# Patient Record
Sex: Female | Born: 1980 | Race: White | Hispanic: No | Marital: Married | Smoking: Current every day smoker
Health system: Southern US, Community
[De-identification: ages and names within clinical notes are randomized; demographics above are authoritative.]

## PROBLEM LIST (undated history)

## (undated) DIAGNOSIS — M5430 Sciatica, unspecified side: Secondary | ICD-10-CM

## (undated) DIAGNOSIS — M549 Dorsalgia, unspecified: Secondary | ICD-10-CM

## (undated) HISTORY — PX: OTHER SURGICAL HISTORY: SHX169

## (undated) HISTORY — DX: Dorsalgia, unspecified: M54.9

## (undated) HISTORY — DX: Sciatica, unspecified side: M54.30

---

## 2008-03-01 HISTORY — PX: ABDOMINAL HYSTERECTOMY: SHX81

## 2016-05-24 ENCOUNTER — Ambulatory Visit
Admission: RE | Admit: 2016-05-24 | Discharge: 2016-05-24 | Disposition: A | Discharge status: Home / Self Care | Payer: Self-pay | Source: Ambulatory Visit | Attending: Oncology | Admitting: Oncology

## 2016-05-24 ENCOUNTER — Encounter: Payer: Self-pay | Admitting: *Deleted

## 2016-05-24 ENCOUNTER — Ambulatory Visit: Payer: Self-pay | Attending: Oncology | Admitting: *Deleted

## 2016-05-24 ENCOUNTER — Other Ambulatory Visit: Payer: Self-pay | Admitting: Oncology

## 2016-05-24 ENCOUNTER — Encounter (INDEPENDENT_AMBULATORY_CARE_PROVIDER_SITE_OTHER): Payer: Self-pay

## 2016-05-24 VITALS — BP 106/74 | HR 56 | Temp 98.5°F | Ht 61.0 in | Wt 137.0 lb

## 2016-05-24 DIAGNOSIS — R928 Other abnormal and inconclusive findings on diagnostic imaging of breast: Secondary | ICD-10-CM

## 2016-05-24 DIAGNOSIS — N63 Unspecified lump in unspecified breast: Secondary | ICD-10-CM

## 2016-05-24 DIAGNOSIS — N6002 Solitary cyst of left breast: Secondary | ICD-10-CM

## 2016-05-24 NOTE — Progress Notes (Signed)
Subjective:     Patient ID: Francine GravenSabrina Ow, female   DOB: 06/23/1980, [age] y.o.   MRN: [id number]  HPI   Review of Systems     Objective:   Physical Exam  Pulmonary/Chest: Right breast exhibits mass. Right breast exhibits no inverted nipple, no nipple discharge, no skin change and no tenderness. Left breast exhibits mass. Left breast exhibits no inverted nipple, no nipple discharge, no skin change and no tenderness. Breasts are symmetrical.         Assessment:     36 year old White female referred by Alvina Filbertenise Hunter, [facility] further evaluation of bilateral breast masses.  Patient states she found a tender left breast mass in October, 2017.  States it is unchanged.  States when she saw her doctor, she found the right breast mass on exam.  On clinical breast exam I can palpate an approximate 3 cm smooth, mobile, fluid feeling mass at 2-3:00 right breast and an approximate 2 cm lobulated tender mass at 2:30-3:00 left breast.  Patient with a partial hysterectomy in her twenties for post delivery bleeding per patient.  Family history includes 2 maternal cousins with breast cancer.  Patient has been screened for eligibility.  She does not have any insurance, Medicare or Medicaid.  She also meets financial eligibility.  Hand-out given on the Affordable Care Act.    Plan:     Will get bilateral diagnostic mammogram and ultrasound.  If no findings on imaging will refer for surgical consult.  Patient agreeable to the plan.

## 2016-05-24 NOTE — Patient Instructions (Signed)
Gave patient hand-out, Women Staying Healthy, Active and Well from [facility], with education on breast health, pap smears, heart and colon health. 

## 2016-05-27 ENCOUNTER — Encounter: Payer: Self-pay | Admitting: *Deleted

## 2016-05-27 ENCOUNTER — Telehealth: Payer: Self-pay | Admitting: *Deleted

## 2016-05-27 NOTE — Telephone Encounter (Signed)
Called and discussed results of patients mammogram and ultrasound.  Patient would like to have bilateral cysts aspirated since they are both tender.  Patient is scheduled to see Dr. Lemar LivingsByrnett on 06/07/16 at  1:30.  She is to arrive 30 minutes early to complete paperwork.  She is to take a photo ID and all her meds with her to the appointment.

## 2016-06-07 ENCOUNTER — Inpatient Hospital Stay: Payer: Self-pay

## 2016-06-07 ENCOUNTER — Ambulatory Visit (INDEPENDENT_AMBULATORY_CARE_PROVIDER_SITE_OTHER): Payer: PRIVATE HEALTH INSURANCE | Admitting: General Surgery

## 2016-06-07 ENCOUNTER — Encounter: Payer: Self-pay | Admitting: General Surgery

## 2016-06-07 VITALS — BP 120/80 | HR 76 | Resp 12 | Ht 60.0 in | Wt 141.0 lb

## 2016-06-07 DIAGNOSIS — N6002 Solitary cyst of left breast: Secondary | ICD-10-CM

## 2016-06-07 DIAGNOSIS — N6001 Solitary cyst of right breast: Secondary | ICD-10-CM

## 2016-06-07 NOTE — Progress Notes (Signed)
Patient ID: Sherry Mccarthy, female   DOB: August 05, 1980, 36 y.o.   MRN: [id number]  Chief Complaint  Patient presents with  . Other    breast cysts    HPI Sherry Mccarthy is a 36 y.o. female here for evaluation of bilateral breast cysts. She had a mammogram with ultrasound done on 05/24/16. She reports that her left breast has been hurting since 11/2015. She also reports the right breast has been painful for the past month. She reports no past breast problems. Her husband Barbara Cower is with her today.  She reports she continually wears a bra. She reports that the left breast pain in constant. She has used OTC pain medication without relief.   HPI  Past Medical History:  Diagnosis Date  . Back pain   . Sciatica     Past Surgical History:  Procedure Laterality Date  . ABDOMINAL HYSTERECTOMY  2010   partial  . carpel tunnel Right   . CESAREAN SECTION     X 2    Family History  Problem Relation Age of Onset  . Breast cancer Neg Hx     Social History Social History  Substance Use Topics  . Smoking status: Current Every Day Smoker    Packs/day: 0.50    Years: 15.00    Types: Cigarettes  . Smokeless tobacco: Never Used  . Alcohol use No    Allergies  Allergen Reactions  . Fentanyl Anaphylaxis  . Tramadol Shortness Of Breath and Rash  . Morphine And Related Rash    Current Outpatient Prescriptions  Medication Sig Dispense Refill  . gabapentin (NEURONTIN) 300 MG capsule Take 300 mg by mouth 3 (three) times daily.     No current facility-administered medications for this visit.     Review of Systems Review of Systems  Constitutional: Negative.   Respiratory: Negative.   Cardiovascular: Negative.     Blood pressure 120/80, pulse 76, resp. rate 12, height 5' (1.524 m), weight 141 lb (64 kg).  Physical Exam Physical Exam  Constitutional: She is oriented to person, place, and time. She appears well-developed and well-nourished.  Eyes: Conjunctivae are normal. No  scleral icterus.  Neck: Normal range of motion.  Cardiovascular: Normal rate, regular rhythm and normal heart sounds.   Pulmonary/Chest: Effort normal and breath sounds normal. Right breast exhibits mass (fullness in the upper inner quadrant) and tenderness. Right breast exhibits no inverted nipple, no nipple discharge and no skin change. Left breast exhibits mass (2 o'clk 5 cm from nipple there is a soft fullness) and tenderness. Left breast exhibits no inverted nipple, no nipple discharge and no skin change.    Lymphadenopathy:    She has no cervical adenopathy.    She has no axillary adenopathy.  Neurological: She is alert and oriented to person, place, and time.  Skin: Skin is warm and dry.  Psychiatric: She has a normal mood and affect.    Data Reviewed 05/24/2016 bilateral mammogram and ultrasound results reviewed. 1.  No mammographic or ultrasound evidence for malignancy. 2. Bilateral simple cysts. 3. Patient requests aspiration of left breast cyst, which is scheduled for the patient.  RECOMMENDATION: 1. Aspiration of left breast cysts. 2. Screening mammogram at age 22 unless there are persistent or intervening clinical concerns. (Code:[id number]-B-40A)  The left breast cyst is small, and the possibility that aspiration will not relieve all of her present breast tenderness was discussed. She desired to proceed with bilateral cyst aspiration.  The left breast at the 2:00 position 5  cm from the nipple showed a well-defined anechoic structure measuring 1.6 x 1.3 x 1.8 cm. This was aspirated using 1 mL of 1% plain Xylocaine with complete resolution. Deeper in the breast adjacent to the pectoralis fascia was a bilobed anechoic structure measuring 0.6 x 1.5 x 1.8 cm. This was aspirated as well with complete resolution.  The right breast examination showed multiple cysts in the area of palpable fullness, the largest measuring 3.6 cm in diameter. Using 1 mL of 1% plain Xylocaine teacher these  lesions was aspirated with complete resolution. BI-RADS-2.  Assessment    Bilateral breast cyst, status post aspiration.    Plan    The patient has been encouraged to make use of Aleve for local discomfort and he consider making use of Protegra/Ocuvite antioxidant vitamins to discourage future cyst formation.  We'll look for a phone follow-up or clinical reassessment in 2 weeks.    HPI, Physical Exam, Assessment and Plan have been scribed under the direction and in the presence of Earline Mayotte, MD  Carron Brazen, LPN  I have completed the exam and reviewed the above documentation for accuracy and completeness.  I agree with the above.  Museum/gallery conservator has been used and any errors in dictation or transcription are unintentional.  Donnalee Curry, M.D., F.A.C.S.  Earline Mayotte 06/08/2016, 8:29 PM

## 2016-06-07 NOTE — Patient Instructions (Addendum)
May try antioxidant vitamins- Ocuvite- twice a day May use Aleve twice daily for 14 days  Follow up in 2 weeks.

## 2016-06-08 DIAGNOSIS — N6001 Solitary cyst of right breast: Secondary | ICD-10-CM | POA: Insufficient documentation

## 2016-06-08 DIAGNOSIS — N6002 Solitary cyst of left breast: Principal | ICD-10-CM

## 2016-06-10 ENCOUNTER — Encounter: Payer: Self-pay | Admitting: *Deleted

## 2016-06-10 NOTE — Progress Notes (Signed)
Patient saw Dr. Lemar Livings of surgical consult.  Benign cysts aspiration.  She is to follow-up with screening mammograms at age 36 unless otherwise indicated.  [facility] to [place].

## 2016-07-13 ENCOUNTER — Ambulatory Visit (INDEPENDENT_AMBULATORY_CARE_PROVIDER_SITE_OTHER): Payer: PRIVATE HEALTH INSURANCE | Admitting: General Surgery

## 2016-07-13 ENCOUNTER — Encounter: Payer: Self-pay | Admitting: General Surgery

## 2016-07-13 VITALS — BP 110/66 | HR 76 | Resp 14 | Ht 62.0 in | Wt 146.0 lb

## 2016-07-13 DIAGNOSIS — N644 Mastodynia: Secondary | ICD-10-CM | POA: Diagnosis not present

## 2016-07-13 MED ORDER — TAMOXIFEN CITRATE 20 MG PO TABS: 20.0000 mg | ORAL_TABLET | Freq: Every day | ORAL | 11 refills | Status: AC

## 2016-07-13 NOTE — Progress Notes (Signed)
Patient ID: Sherry Mccarthy, female   DOB: 1980/06/15, 36 y.o.   MRN: [id number]  Chief Complaint  Patient presents with  . Follow-up    breast pain    HPI Sherry Mccarthy is a 36 y.o. female.  Here for follow up breast pain, left > right. She states the cyst were drained last time (April) that made them feel better. She states the tenderness started a couple of weeks ago. The tenderness is more on the lateral side of left breast. She states it seems to be tender on a more constant basis. She does admit to 2-12 ounce Mt Dew beverages a day.  HPI  Past Medical History:  Diagnosis Date  . Back pain   . Sciatica     Past Surgical History:  Procedure Laterality Date  . ABDOMINAL HYSTERECTOMY  2010   partial  . carpel tunnel Right   . CESAREAN SECTION     X 2    Family History  Problem Relation Age of Onset  . Breast cancer Neg Hx     Social History Social History  Substance Use Topics  . Smoking status: Current Every Day Smoker    Packs/day: 0.50    Years: 15.00    Types: Cigarettes  . Smokeless tobacco: Never Used  . Alcohol use No    Allergies  Allergen Reactions  . Fentanyl Anaphylaxis  . Tramadol Shortness Of Breath and Rash  . Morphine And Related Rash    Current Outpatient Prescriptions  Medication Sig Dispense Refill  . gabapentin (NEURONTIN) 300 MG capsule Take 300 mg by mouth 3 (three) times daily.    . tamoxifen (NOLVADEX) 20 MG tablet Take 1 tablet (20 mg total) by mouth daily. 30 tablet 11   No current facility-administered medications for this visit.     Review of Systems Review of Systems  Constitutional: Negative.   Respiratory: Negative.   Cardiovascular: Negative.   Neurological: Positive for headaches.    Blood pressure 110/66, pulse 76, resp. rate 14, height 5\' 2"  (1.575 m), weight 146 lb (66.2 kg).  Physical Exam Physical Exam  Constitutional: She is oriented to person, place, and time. She appears well-developed and  well-nourished.  HENT:  Mouth/Throat: Oropharynx is clear and moist.  Eyes: Conjunctivae are normal. No scleral icterus.  Neck: Neck supple.  Pulmonary/Chest: Right breast exhibits no inverted nipple, no mass, no nipple discharge, no skin change and no tenderness. Left breast exhibits tenderness. Left breast exhibits no inverted nipple, no mass, no nipple discharge and no skin change.    Tender left UOQ  Lymphadenopathy:    She has no cervical adenopathy.    She has no axillary adenopathy.  Neurological: She is alert and oriented to person, place, and time.  Skin: Skin is warm and dry.  Psychiatric: Her behavior is normal.    Data Reviewed Prior ultrasound and mammograms.  Assessment    Mastalgia.    Plan    Patient thinks it's unlikely she'll be able to discontinue caffeine consumption. She does report that she is making use of Ocuvite vitamins for anti-inflammatory effects as requested.  Option for trial of hormone suppression was reviewed. Risks of DVT discussed. We'll make use of tamoxifen 20 mg daily for 1 month with a phone/my chart message at that time. Patient make use of a daily baby aspirin to minimize DVT risk.     Discussed options. Trial of Tamoxifen for one month and baby aspirin daily. Follow up in one month. The patient  is aware to call back for any questions or concerns.    HPI, Physical Exam, Assessment and Plan have been scribed under the direction and in the presence of Earline MayotteJeffrey W. Byrnett, MD.  Dorathy DaftMarsha Hatch, RN  I have completed the exam and reviewed the above documentation for accuracy and completeness.  I agree with the above.  Museum/gallery conservatorDragon Technology has been used and any errors in dictation or transcription are unintentional.  Donnalee CurryJeffrey Byrnett, M.D., F.A.C.S.   Earline MayotteByrnett, Jeffrey W 07/14/2016, 8:02 PM

## 2016-07-13 NOTE — Patient Instructions (Addendum)
The patient is aware to call back for any questions or concerns. Baby aspirin daily as well

## 2016-07-14 DIAGNOSIS — N644 Mastodynia: Secondary | ICD-10-CM | POA: Insufficient documentation

## 2016-08-17 ENCOUNTER — Telehealth: Payer: Self-pay | Admitting: *Deleted

## 2016-08-17 ENCOUNTER — Ambulatory Visit: Payer: PRIVATE HEALTH INSURANCE | Admitting: General Surgery

## 2016-08-17 NOTE — Telephone Encounter (Signed)
-----   Message from Earline MayotteJeffrey W Byrnett, MD sent at 08/17/2016  4:11 PM EDT ----- Patient placed on Tamoxifen last month for breast pain. Was to give a phone/ my chart f/u message this month. Missed today's appt.  See how it is doing. Thanks.

## 2016-08-24 NOTE — Telephone Encounter (Signed)
Notified patient, she is on vacation and will call back to r/s missed appt. She states the medication is helping so far.

## 2016-09-14 ENCOUNTER — Encounter: Payer: Self-pay | Admitting: *Deleted

## 2016-09-29 ENCOUNTER — Ambulatory Visit: Payer: PRIVATE HEALTH INSURANCE | Admitting: General Surgery

## 2016-10-27 ENCOUNTER — Ambulatory Visit: Payer: PRIVATE HEALTH INSURANCE | Admitting: General Surgery

## 2016-11-09 ENCOUNTER — Other Ambulatory Visit: Payer: Self-pay | Admitting: Internal Medicine

## 2016-11-10 ENCOUNTER — Other Ambulatory Visit: Payer: Self-pay | Admitting: Internal Medicine

## 2016-11-10 ENCOUNTER — Other Ambulatory Visit (HOSPITAL_COMMUNITY): Payer: Self-pay | Admitting: Internal Medicine

## 2016-11-10 DIAGNOSIS — R102 Pelvic and perineal pain: Secondary | ICD-10-CM

## 2016-11-11 ENCOUNTER — Ambulatory Visit: Payer: PRIVATE HEALTH INSURANCE | Admitting: General Surgery

## 2016-11-23 ENCOUNTER — Ambulatory Visit: Payer: 59

## 2016-12-28 ENCOUNTER — Encounter: Payer: Self-pay | Admitting: *Deleted

## 2017-01-10 ENCOUNTER — Encounter: Payer: Self-pay | Admitting: General Surgery

## 2017-01-10 ENCOUNTER — Ambulatory Visit (INDEPENDENT_AMBULATORY_CARE_PROVIDER_SITE_OTHER): Payer: PRIVATE HEALTH INSURANCE | Admitting: General Surgery

## 2017-01-10 ENCOUNTER — Inpatient Hospital Stay: Payer: Self-pay

## 2017-01-10 VITALS — BP 120/70 | HR 82 | Resp 12 | Ht 60.0 in | Wt 148.0 lb

## 2017-01-10 DIAGNOSIS — N644 Mastodynia: Secondary | ICD-10-CM

## 2017-01-10 NOTE — Patient Instructions (Addendum)
Take one 81mg  Aspirin daily. Try taking Ocuvite vitamins again for 3 months.

## 2017-01-10 NOTE — Progress Notes (Signed)
Patient ID: Sherry Mccarthy, female   DOB: September 11, 1980, 36 y.o.   MRN: [id number]  Chief Complaint  Patient presents with  . Follow-up    mastalgia    HPI Sherry Mccarthy is a 36 y.o. female here today for her one month left mastalgia. She has some tenderness and soreness that has worsened. She has been using the Tamoxifen and this has helped with hot flashes and breast pain. She is not using the Ocuvite Vitamins. She is her today with her daughter.  The patient made use of Ocuvite vitamins for about a month. She is still drinking [employer]he[employer]ountain Dew 2 cans a day and still smokes.   HPI  Past Medical History:  Diagnosis Date  . Back pain   . Sciatica     Past Surgical History:  Procedure Laterality Date  . ABDOMINAL HYSTERECTOMY  2010   partial  . carpel tunnel Right   . CESAREAN SECTION     X 2    Family History  Problem Relation Age of Onset  . Breast cancer Neg Hx     Social History Social History   Tobacco Use  . Smoking status: Current Every Day Smoker    Packs/day: 0.50    Years: 15.00    Pack years: 7.50    Types: Cigarettes  . Smokeless tobacco: Never Used  Substance Use Topics  . Alcohol use: No  . Drug use: No    Allergies  Allergen Reactions  . Fentanyl Anaphylaxis  . Tramadol Shortness Of Breath and Rash  . Morphine And Related Rash    Current Outpatient Medications  Medication Sig Dispense Refill  . gabapentin (NEURONTIN) 300 MG capsule Take 300 mg by mouth 3 (three) times daily.    . tamoxifen (NOLVADEX) 20 MG tablet Take 1 tablet (20 mg total) by mouth daily. 30 tablet 11   No current facility-administered medications for this visit.     Review of Systems Review of Systems  Constitutional: Negative.   Respiratory: Negative.   Cardiovascular: Negative.     Blood pressure 120/70, pulse 82, resp. rate 12, height 5' (1.524 m), weight 148 lb (67.1 kg).  Physical Exam Physical Exam  Constitutional: She is oriented to person, place, and  time. She appears well-developed and well-nourished.  Eyes: Conjunctivae are normal. No scleral icterus.  Neck: Neck supple.  Cardiovascular: Normal rate, regular rhythm and normal heart sounds.  Pulmonary/Chest: Effort normal and breath sounds normal. Right breast exhibits no inverted nipple, no mass, no nipple discharge, no skin change and no tenderness. Left breast exhibits no inverted nipple, no mass, no nipple discharge, no skin change and no tenderness.    Lymphadenopathy:    She has no cervical adenopathy.    She has no axillary adenopathy.  Neurological: She is alert and oriented to person, place, and time.  Skin: Skin is warm and dry.  Psychiatric: She has a normal mood and affect.    Data Reviewed Ultrasound examination was undertaken to determine if any significant cystic disease accounting for focal tenderness was evident. The patient shows significant dilated ducts in the retroareolar area up to 0.49 cm. No intraductal lesions are noted. (No history of nipple drainage. There are small cysts throughout the breast, the largest measuring 0.51 cm in the 3:00 position, 2 cm from the nipple. 2 small to warrant aspiration. BIRAD-2.  Assessment    Mastalgia, persistent activities that would worsen her symptoms including smoking and caffeine consumption.    Plan  Discussed use of Ocuvite Vitamins, decreased caffeine intake, and smoking cessation.   We will plan for repeat clinical exam in spring 2019with mammograms prior to that visit.     HPI, Physical Exam, Assessment and Plan have been scribed under the direction and in the presence of Earline MayotteJeffrey Mccarthy. Hermen Mario, MD  Sherry Brazenaryl-Lyn Kennedy, LPN  I have completed the exam and reviewed the above documentation for accuracy and completeness.  I agree with the above.  Museum/gallery conservatorDragon Mccarthy has been used and any errors in dictation or transcription are unintentional.  Sherry Mccarthy, M.D., F.A.C.S.   Earline MayotteByrnett, Sherry Mccarthy 01/10/2017, 8:14  PM

## 2017-05-18 ENCOUNTER — Telehealth: Payer: Self-pay | Admitting: *Deleted

## 2017-05-18 MED ORDER — TAMOXIFEN CITRATE 20 MG PO TABS: 20.0000 mg | ORAL_TABLET | Freq: Every day | ORAL | 1 refills | Status: AC

## 2017-05-18 NOTE — Telephone Encounter (Signed)
Left detailed message.   

## 2017-05-18 NOTE — Telephone Encounter (Signed)
Patient was last seen in 01/10/2017, she stated that she is moving back to [place] next Saturday March 30th. She is currently on Tamoxifen and has just a few refills left. Patient was wanting to know if she can get more refills until she can find a physician in [place] or does she need to come in for an appointment before the end of next week?

## 2017-05-18 NOTE — Telephone Encounter (Signed)
Patient has made use of tamoxifen 2 mg daily for control of breast pain and mastalgia.  We will renew a prescription to allow her time to find a new physician in [place].  She will be asked to make use of a pediatric aspirin daily to minimize the risk of DVT.

## 2017-12-31 IMAGING — US US BREAST*L* LIMITED INC AXILLA
1 series · 6 of 6 positions shown · non-contrast
Comparison: None, baseline

CLINICAL DATA: Palpable abnormalities bilaterally.

EXAM:
2D DIGITAL DIAGNOSTIC BILATERAL MAMMOGRAM WITH CAD AND ADJUNCT TOMO
ULTRASOUND BILATERAL BREAST

[Series 1: us breast*left* limited inc axilla · 0.06mm/px · 6 of 6 slices shown]
[im 1/6]
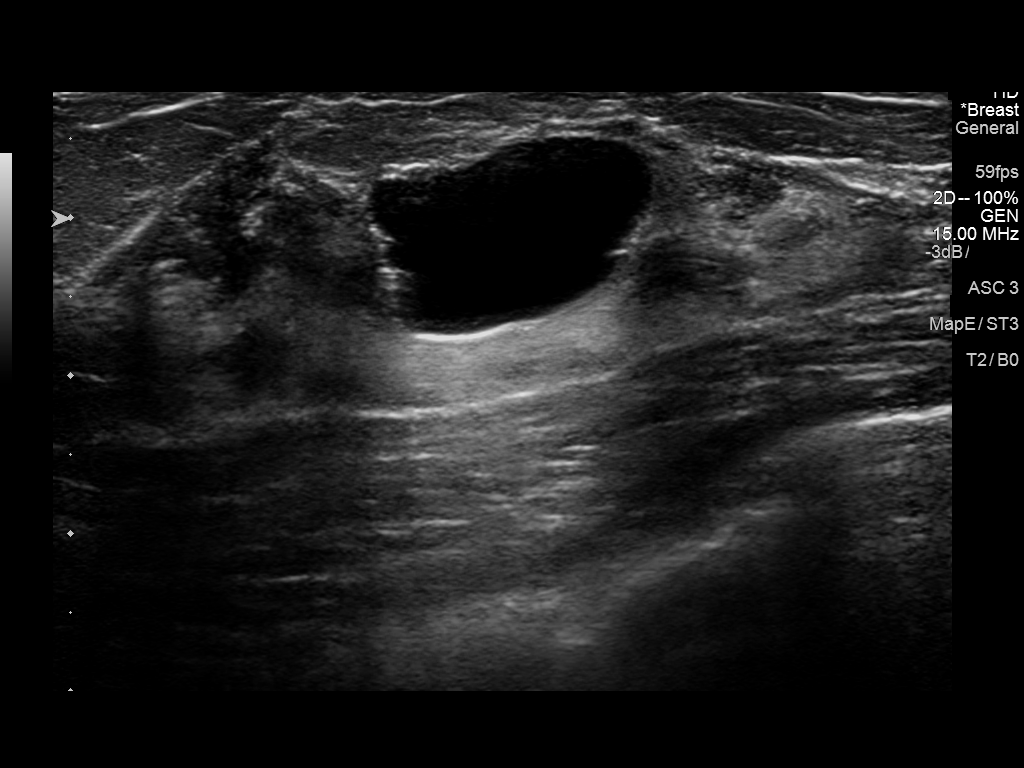
[im 2/6]
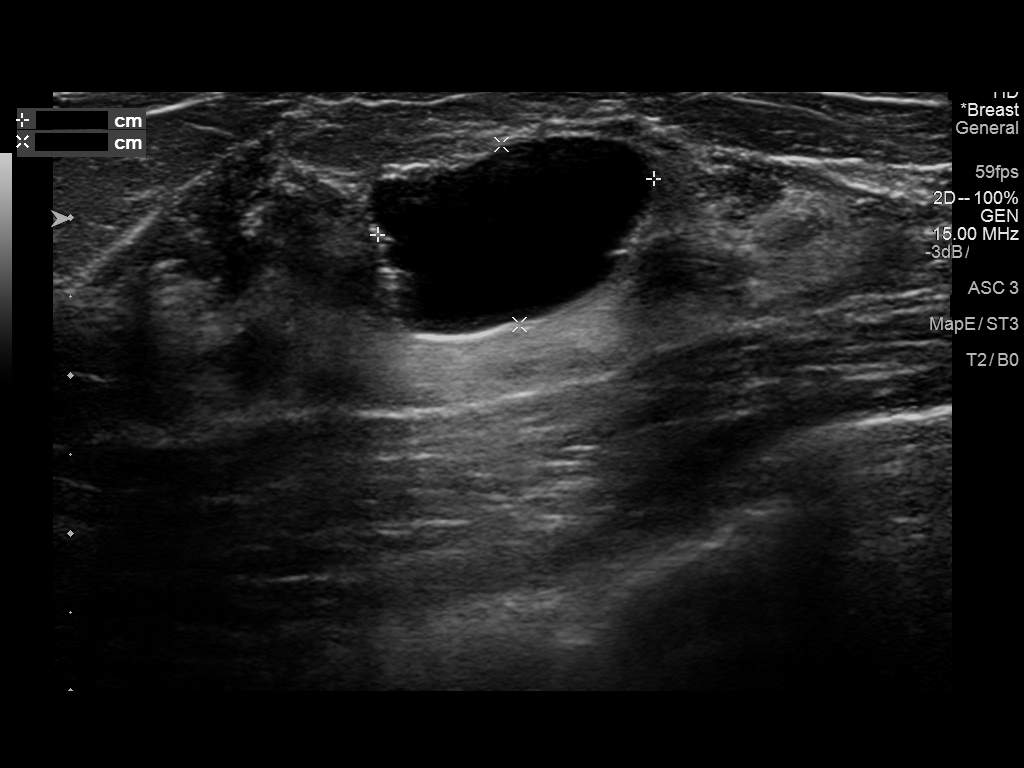
[im 3/6]
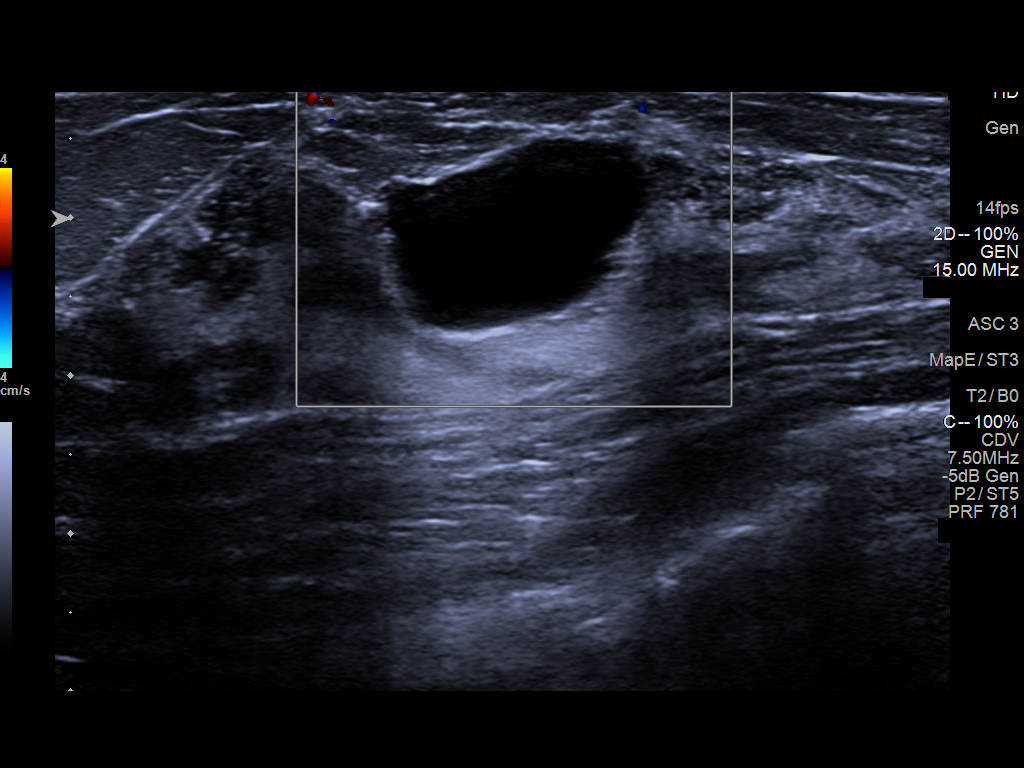
[im 4/6]
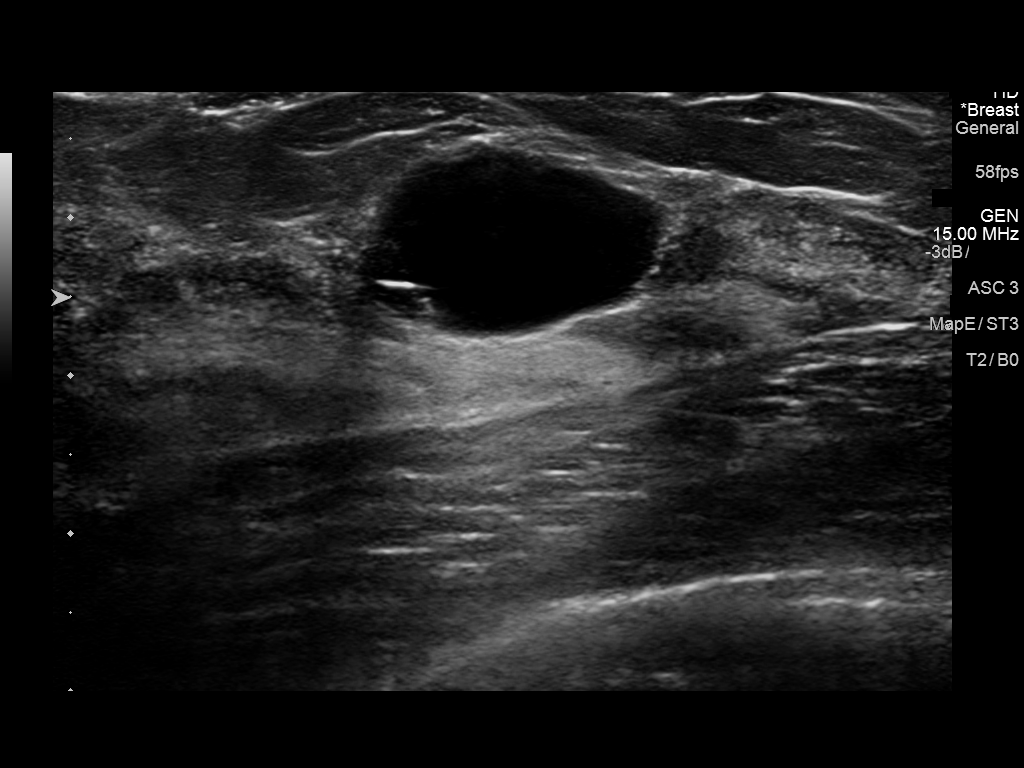
[im 5/6]
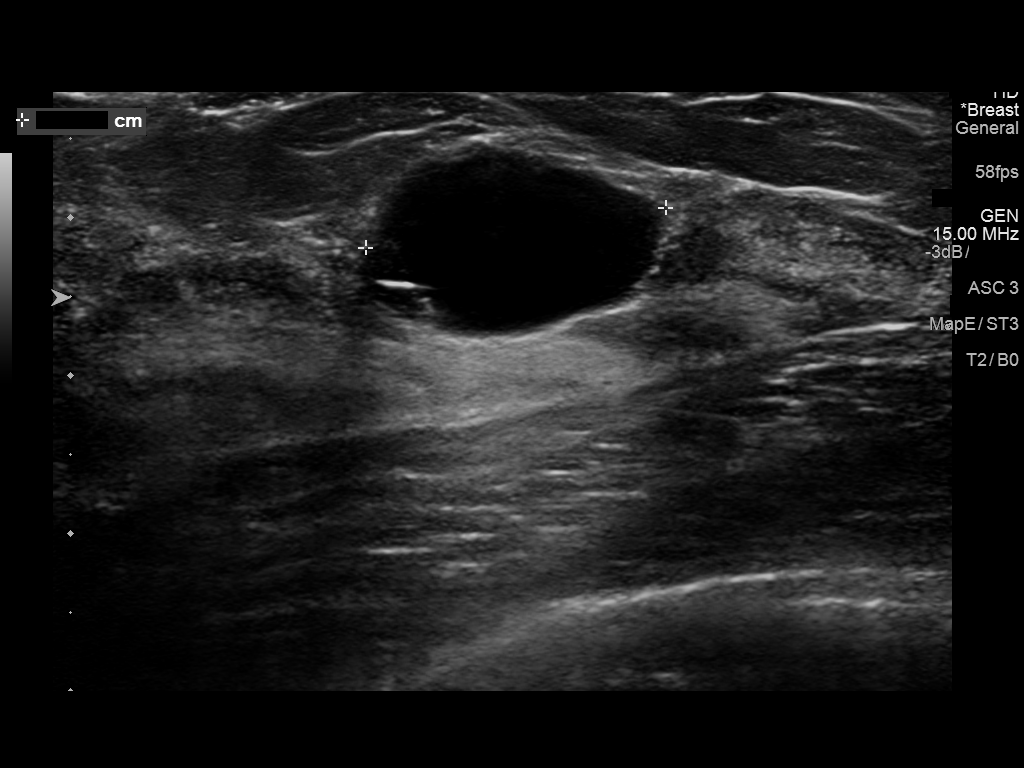
[im 6/6]
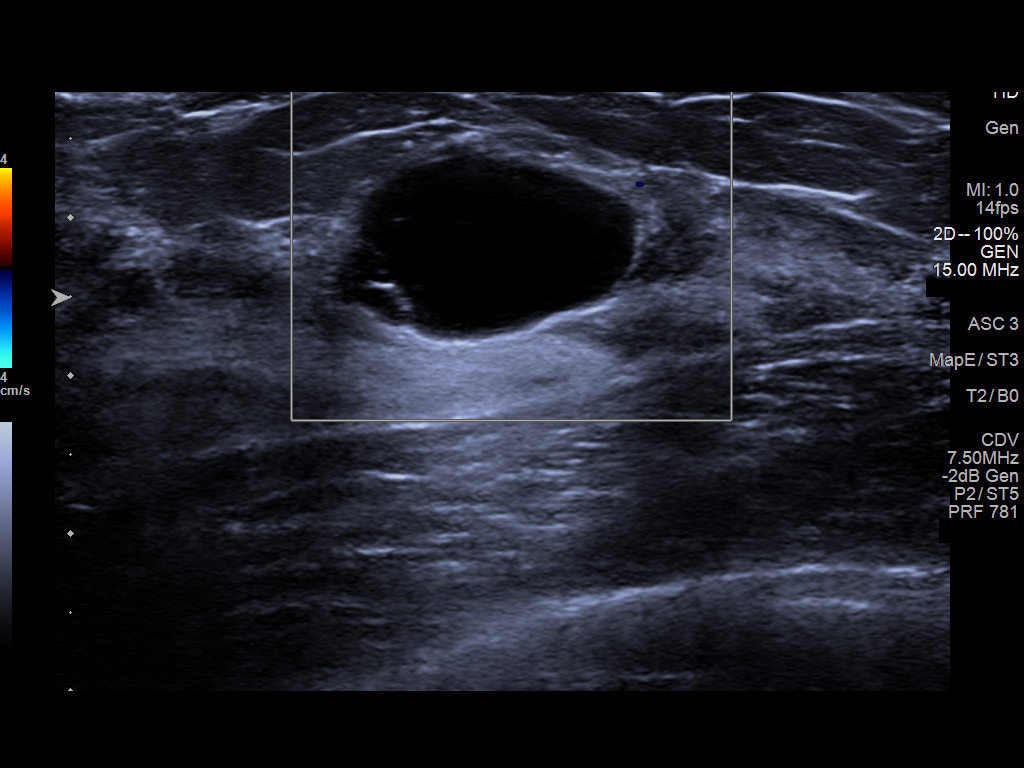

[6 of 6 positions shown; findings below may reference images not displayed]

ACR Breast Density Category d: The breast tissue is extremely dense,
which lowers the sensitivity of mammography.
FINDINGS: There are partially obscured round masses bilaterally. Within the
medial portion of the right breast, there is a circumscribed oval
mass corresponding to the area of patient's concern. In the
upper-outer quadrant of the left breast, dense fibroglandular tissue
is imaged on spot tangential view in the area of concern. No areas
of distortion or suspicious microcalcifications are identified.

Mammographic images were processed with CAD.

Right breast: On physical exam, I palpate a rounded mobile mass in
the medial aspect of the right breast.

Targeted ultrasound is performed, showing a benign cyst in the 3
o'clock location of the right breast 5 cm from nipple which measures
3.5 x 1.6 x 2.7 cm. An adjacent cyst with single septation is 3.1 x
1.6 x 3.2 cm.

Left breast: I palpate soft breast tissue in the upper-outer
quadrant of the left breast in the area of patient's concern.
Although tender on physical exam, I palpate no discrete mass in this
region. Targeted ultrasound in this region reveals a simple cyst in
the 2 o'clock location of the left breast 3 cm from nipple measuring
1.8 x 1.1 x 1.9 cm. No solid mass or acoustic shadowing identified.
The patient reports significant tenderness associated with the left
breast cyst. We discussed the option of cyst aspiration.
IMPRESSION: 1.  No mammographic or ultrasound evidence for malignancy.
2. Bilateral simple cysts.
3. Patient requests aspiration of left breast cyst, which is
scheduled for the patient.

RECOMMENDATION:
1. Aspiration of left breast cysts.
2. Screening mammogram at age 40 unless there are persistent or
intervening clinical concerns. (Code:R1-E-S23)

I have discussed the findings and recommendations with the patient.
Results were also provided in writing at the conclusion of the
visit. If applicable, a reminder letter will be sent to the patient
regarding the next appointment.

BI-RADS CATEGORY  2: Benign.

## 2017-12-31 IMAGING — US US BREAST*R* LIMITED INC AXILLA
1 series · 11 of 11 positions shown · non-contrast
Comparison: None, baseline

CLINICAL DATA: Palpable abnormalities bilaterally.

EXAM:
2D DIGITAL DIAGNOSTIC BILATERAL MAMMOGRAM WITH CAD AND ADJUNCT TOMO
ULTRASOUND BILATERAL BREAST

[Series 1: us breast*right* limited inc axilla · 0.06mm/px · 11 of 11 slices shown]
[im 1/11]
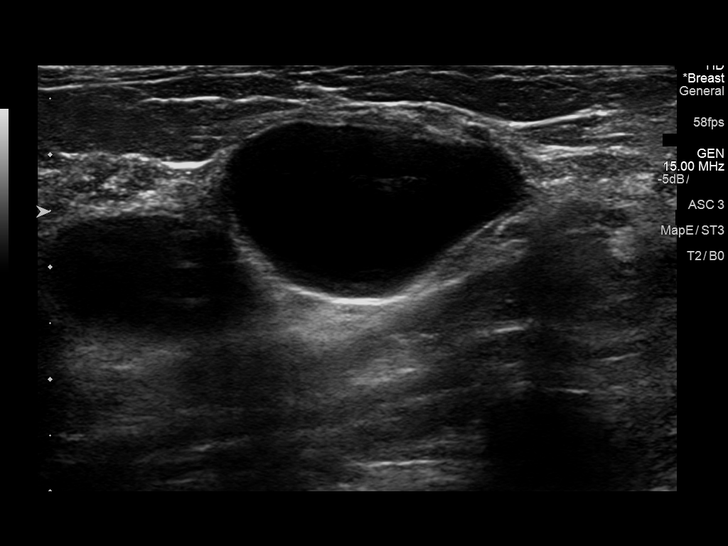
[im 2/11]
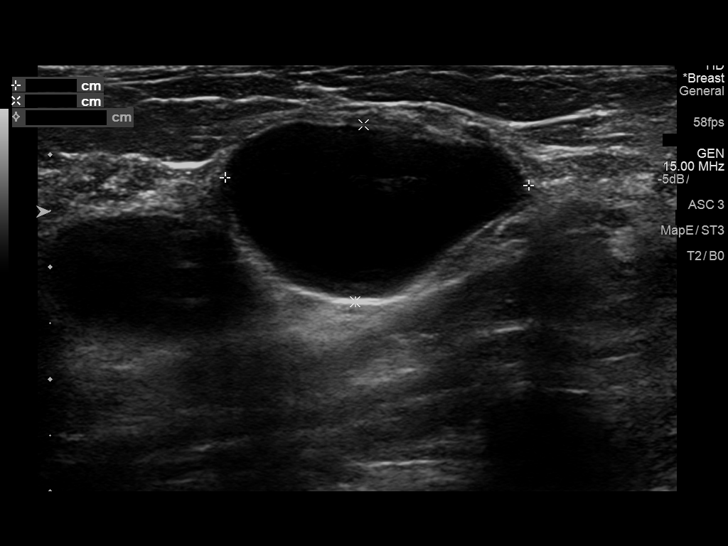
[im 3/11]
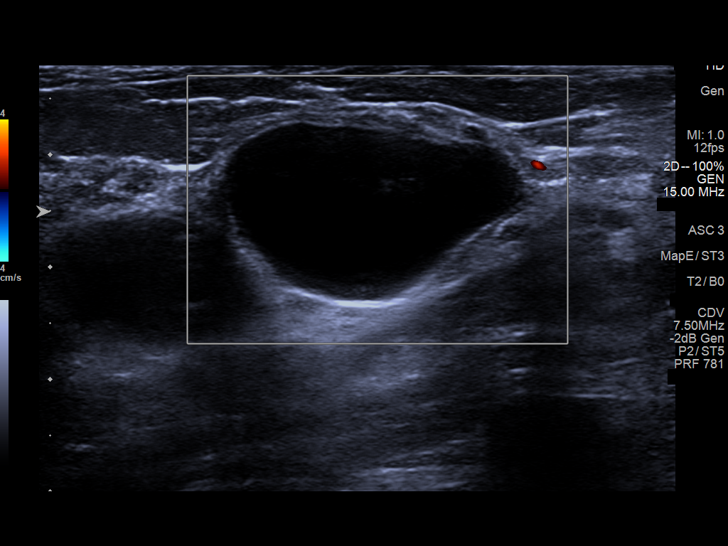
[im 4/11]
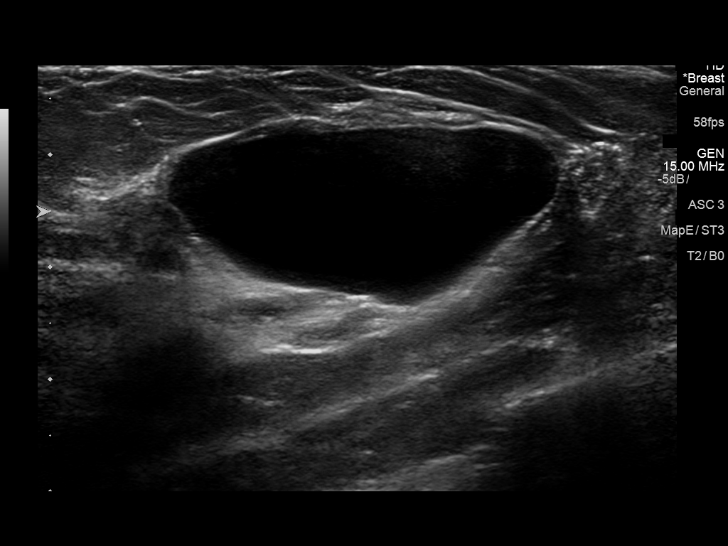
[im 5/11]
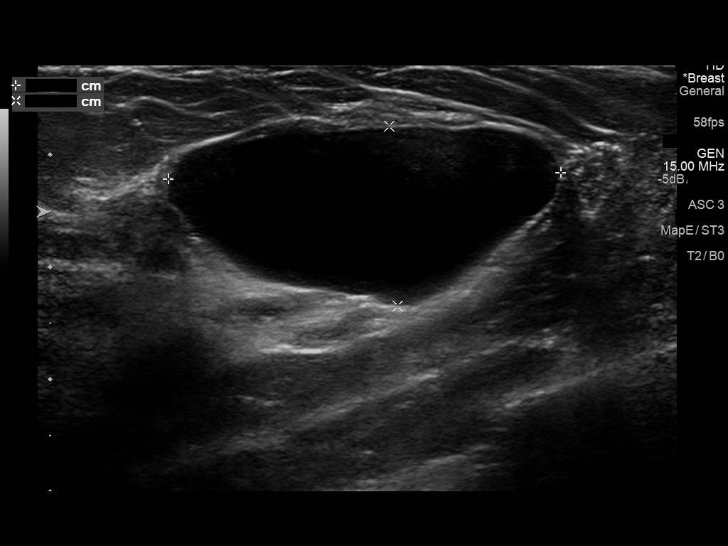
[im 6/11]
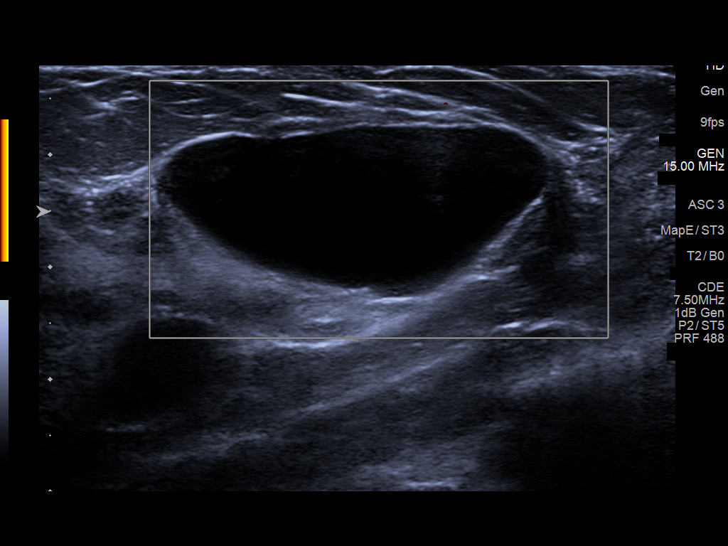
[im 7/11]
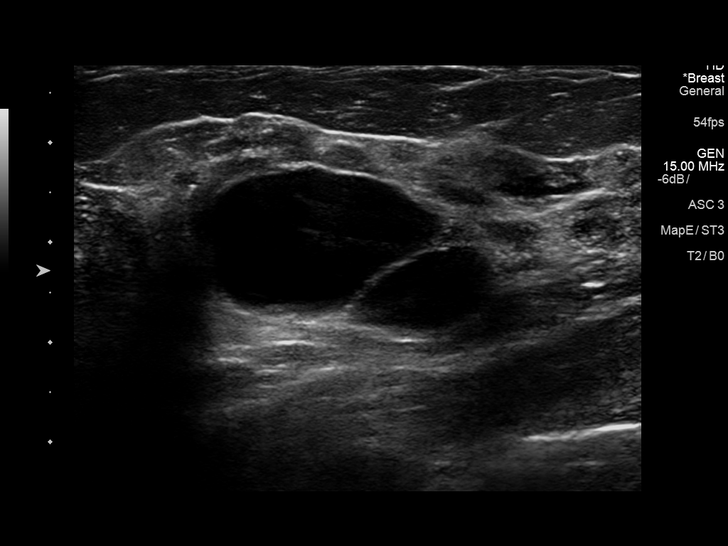
[im 8/11]
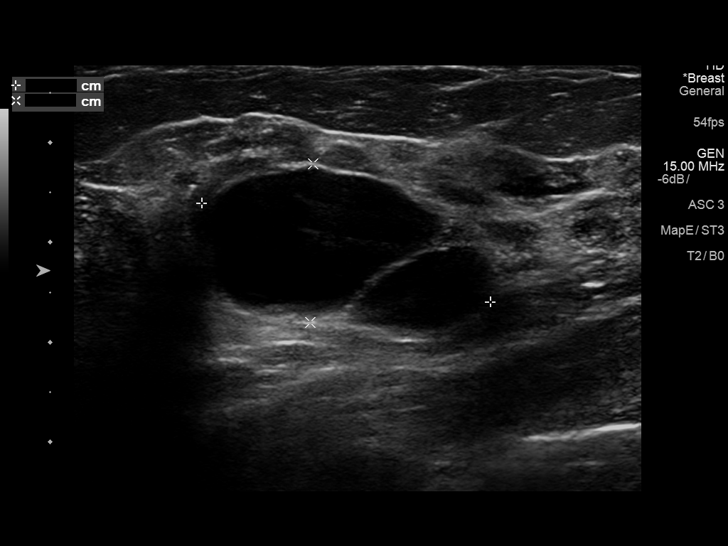
[im 9/11]
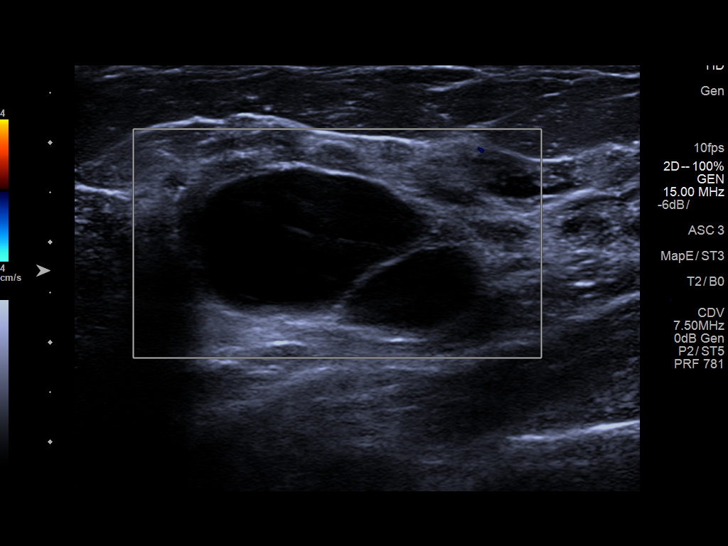
[im 10/11]
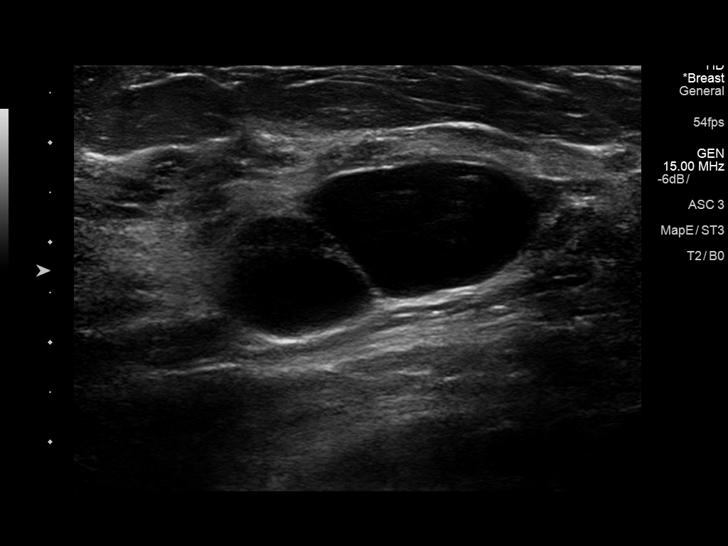
[im 11/11]
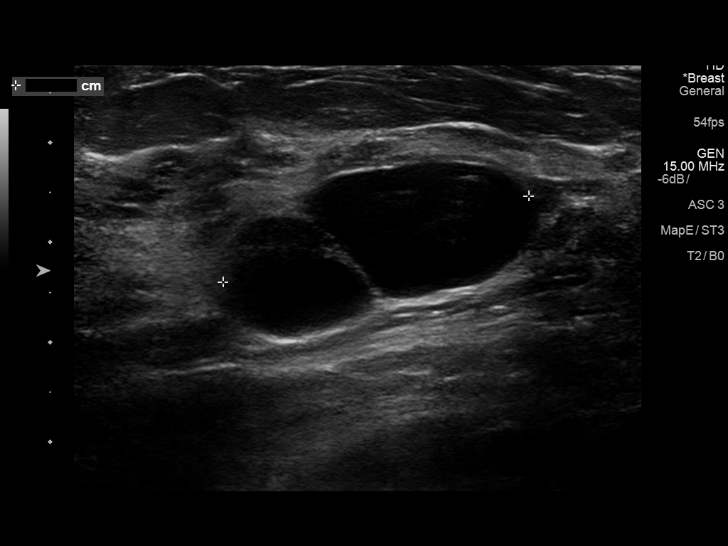

[11 of 11 positions shown; findings below may reference images not displayed]

ACR Breast Density Category d: The breast tissue is extremely dense,
which lowers the sensitivity of mammography.
FINDINGS: There are partially obscured round masses bilaterally. Within the
medial portion of the right breast, there is a circumscribed oval
mass corresponding to the area of patient's concern. In the
upper-outer quadrant of the left breast, dense fibroglandular tissue
is imaged on spot tangential view in the area of concern. No areas
of distortion or suspicious microcalcifications are identified.

Mammographic images were processed with CAD.

Right breast: On physical exam, I palpate a rounded mobile mass in
the medial aspect of the right breast.

Targeted ultrasound is performed, showing a benign cyst in the 3
o'clock location of the right breast 5 cm from nipple which measures
3.5 x 1.6 x 2.7 cm. An adjacent cyst with single septation is 3.1 x
1.6 x 3.2 cm.

Left breast: I palpate soft breast tissue in the upper-outer
quadrant of the left breast in the area of patient's concern.
Although tender on physical exam, I palpate no discrete mass in this
region. Targeted ultrasound in this region reveals a simple cyst in
the 2 o'clock location of the left breast 3 cm from nipple measuring
1.8 x 1.1 x 1.9 cm. No solid mass or acoustic shadowing identified.
The patient reports significant tenderness associated with the left
breast cyst. We discussed the option of cyst aspiration.
IMPRESSION: 1.  No mammographic or ultrasound evidence for malignancy.
2. Bilateral simple cysts.
3. Patient requests aspiration of left breast cyst, which is
scheduled for the patient.

RECOMMENDATION:
1. Aspiration of left breast cysts.
2. Screening mammogram at age 40 unless there are persistent or
intervening clinical concerns. (Code:R1-E-S23)

I have discussed the findings and recommendations with the patient.
Results were also provided in writing at the conclusion of the
visit. If applicable, a reminder letter will be sent to the patient
regarding the next appointment.

BI-RADS CATEGORY  2: Benign.
